# Patient Record
Sex: Male | Born: 2000 | Race: White | Hispanic: No | Marital: Single | Smoking: Never smoker
Health system: Southern US, Community
[De-identification: ages and names within clinical notes are randomized; demographics above are authoritative.]

---

## 2020-12-04 ENCOUNTER — Emergency Department (HOSPITAL_COMMUNITY)
Admission: EM | Admit: 2020-12-04 | Discharge: 2020-12-05 | Disposition: A | Discharge status: Home / Self Care | Payer: BLUE CROSS/BLUE SHIELD | Attending: Emergency Medicine | Admitting: Emergency Medicine

## 2020-12-04 DIAGNOSIS — S61312A Laceration without foreign body of right middle finger with damage to nail, initial encounter: Secondary | ICD-10-CM | POA: Insufficient documentation

## 2020-12-04 DIAGNOSIS — W231XXA Caught, crushed, jammed, or pinched between stationary objects, initial encounter: Secondary | ICD-10-CM | POA: Insufficient documentation

## 2020-12-04 DIAGNOSIS — S61309A Unspecified open wound of unspecified finger with damage to nail, initial encounter: Secondary | ICD-10-CM

## 2020-12-04 DIAGNOSIS — S6992XA Unspecified injury of left wrist, hand and finger(s), initial encounter: Secondary | ICD-10-CM | POA: Diagnosis present

## 2020-12-04 NOTE — ED Provider Notes (Signed)
MSE was initiated and I personally evaluated the patient and placed orders (if any) at  11:57 PM on December 04, 2020.  Finger injury today, tip pinched by recliner, this evening. Nail injury.   Today's Vitals   12/04/20 2258  BP: 130/63  Pulse: 70  Resp: (!) 22  Temp: 98.4 F (36.9 C)  TempSrc: Oral  SpO2: 98%   There is no height or weight on file to calculate BMI.  Right middle finger has nail lac, subungual bleeding, small lac to pad.   The patient appears stable so that the remainder of the MSE may be completed by another provider.   Elpidio Anis, PA-C 12/04/20 2358    Glynn Octave, MD 12/05/20 [phone]

## 2020-12-05 ENCOUNTER — Encounter (HOSPITAL_COMMUNITY): Payer: Self-pay | Admitting: Emergency Medicine

## 2020-12-05 ENCOUNTER — Other Ambulatory Visit: Payer: Self-pay

## 2020-12-05 ENCOUNTER — Emergency Department (HOSPITAL_COMMUNITY): Payer: BLUE CROSS/BLUE SHIELD

## 2020-12-05 NOTE — ED Notes (Signed)
Finger splint applied with kerlix

## 2020-12-05 NOTE — ED Triage Notes (Signed)
Patient injured his right distal middle finger this evening at his recliner , presents with broken finger nail with mild bleeding/swelling .

## 2020-12-05 NOTE — ED Provider Notes (Signed)
[facility] EMERGENCY DEPARTMENT Provider Note   CSN: [id number] Arrival date & time: 12/04/20  2252     History Chief Complaint  Patient presents with  . Finger Injury    Randall Guerrero is a 20 y.o. male.  Patient presents to the emergency department for evaluation of injury to right middle finger.  Patient got his finger caught in a recliner and injured the fingernail.  Patient reports that he was unable to get the bleeding to stop so he presented for evaluation.  Tetanus is up-to-date.  Patient with mild to moderate pain at the distal portion of the middle finger.        History reviewed. No pertinent past medical history.  There are no problems to display for this patient.   History reviewed. No pertinent surgical history.     No family history on file.  Social History   Tobacco Use  . Smoking status: Never Smoker  . Smokeless tobacco: Never Used  Substance Use Topics  . Alcohol use: Never  . Drug use: Never    Home Medications Prior to Admission medications   Not on File    Allergies    Patient has no known allergies.  Review of Systems   Review of Systems  Skin: Positive for wound.  Neurological: Negative.     Physical Exam Updated Vital Signs BP 130/63 (BP Location: Right Arm)   Pulse 70   Temp 98.4 F (36.9 C) (Oral)   Resp (!) 22   Ht 5\' 11"  (1.803 m)   Wt 62 kg   SpO2 98%   BMI 19.06 kg/m   Physical Exam Vitals and nursing note reviewed.  Constitutional:      Appearance: Normal appearance.  HENT:     Head: Atraumatic.  Musculoskeletal:     Right hand: Laceration (Superficial pad of right middle finger) and tenderness (Middle finger) present. No deformity or bony tenderness. Normal strength. Normal sensation. There is no disruption of two-point discrimination. Normal capillary refill.     Comments: Horizontal crack across midportion of fingernail right middle finger, slight oozing of blood  Skin:     Findings: Laceration present.  Neurological:     Mental Status: He is alert.     Sensory: Sensation is intact.     Motor: Motor function is intact.     ED Results / Procedures / Treatments   Labs (all labs ordered are listed, but only abnormal results are displayed) Labs Reviewed - No data to display  EKG None  Radiology DG Finger Middle Right  Result Date: 12/05/2020 CLINICAL DATA:  Right finger laceration EXAM: RIGHT MIDDLE FINGER 2+V COMPARISON:  None. FINDINGS: There is no evidence of fracture or dislocation. There is no evidence of arthropathy or other focal bone abnormality. Soft tissues are unremarkable. IMPRESSION: Negative. Electronically Signed   By: 12/07/2020 MD   On: 12/05/2020 00:22    Procedures Procedures   Medications Ordered in ED Medications - No data to display  ED Course  I have reviewed the triage vital signs and the nursing notes.  Pertinent labs & imaging results that were available during my care of the patient were reviewed by me and considered in my medical decision making (see chart for details).    MDM Rules/Calculators/A&P                          Patient with injury to right middle finger fingernail.  There is no obvious deformity or significant injury to the underlying nail bed.  Do not believe the patient requires removal of the nail or repair of the nailbed at this time.  We will clean the wound and apply dressing, protective splint.  X-ray negative for underlying fracture.  Tetanus up-to-date.  Final Clinical Impression(s) / ED Diagnoses Final diagnoses:  Fingernail avulsion, partial, initial encounter    Rx / DC Orders ED Discharge Orders    None       Knowledge Escandon, Canary Brim, MD 12/05/20 [phone]

## 2022-01-11 IMAGING — DX DG FINGER MIDDLE 2+V*R*
3 series · 3 of 3 positions shown · non-contrast
Comparison: None.

CLINICAL DATA: Right finger laceration

EXAM:
RIGHT MIDDLE FINGER 2+V

[x finger pa right]
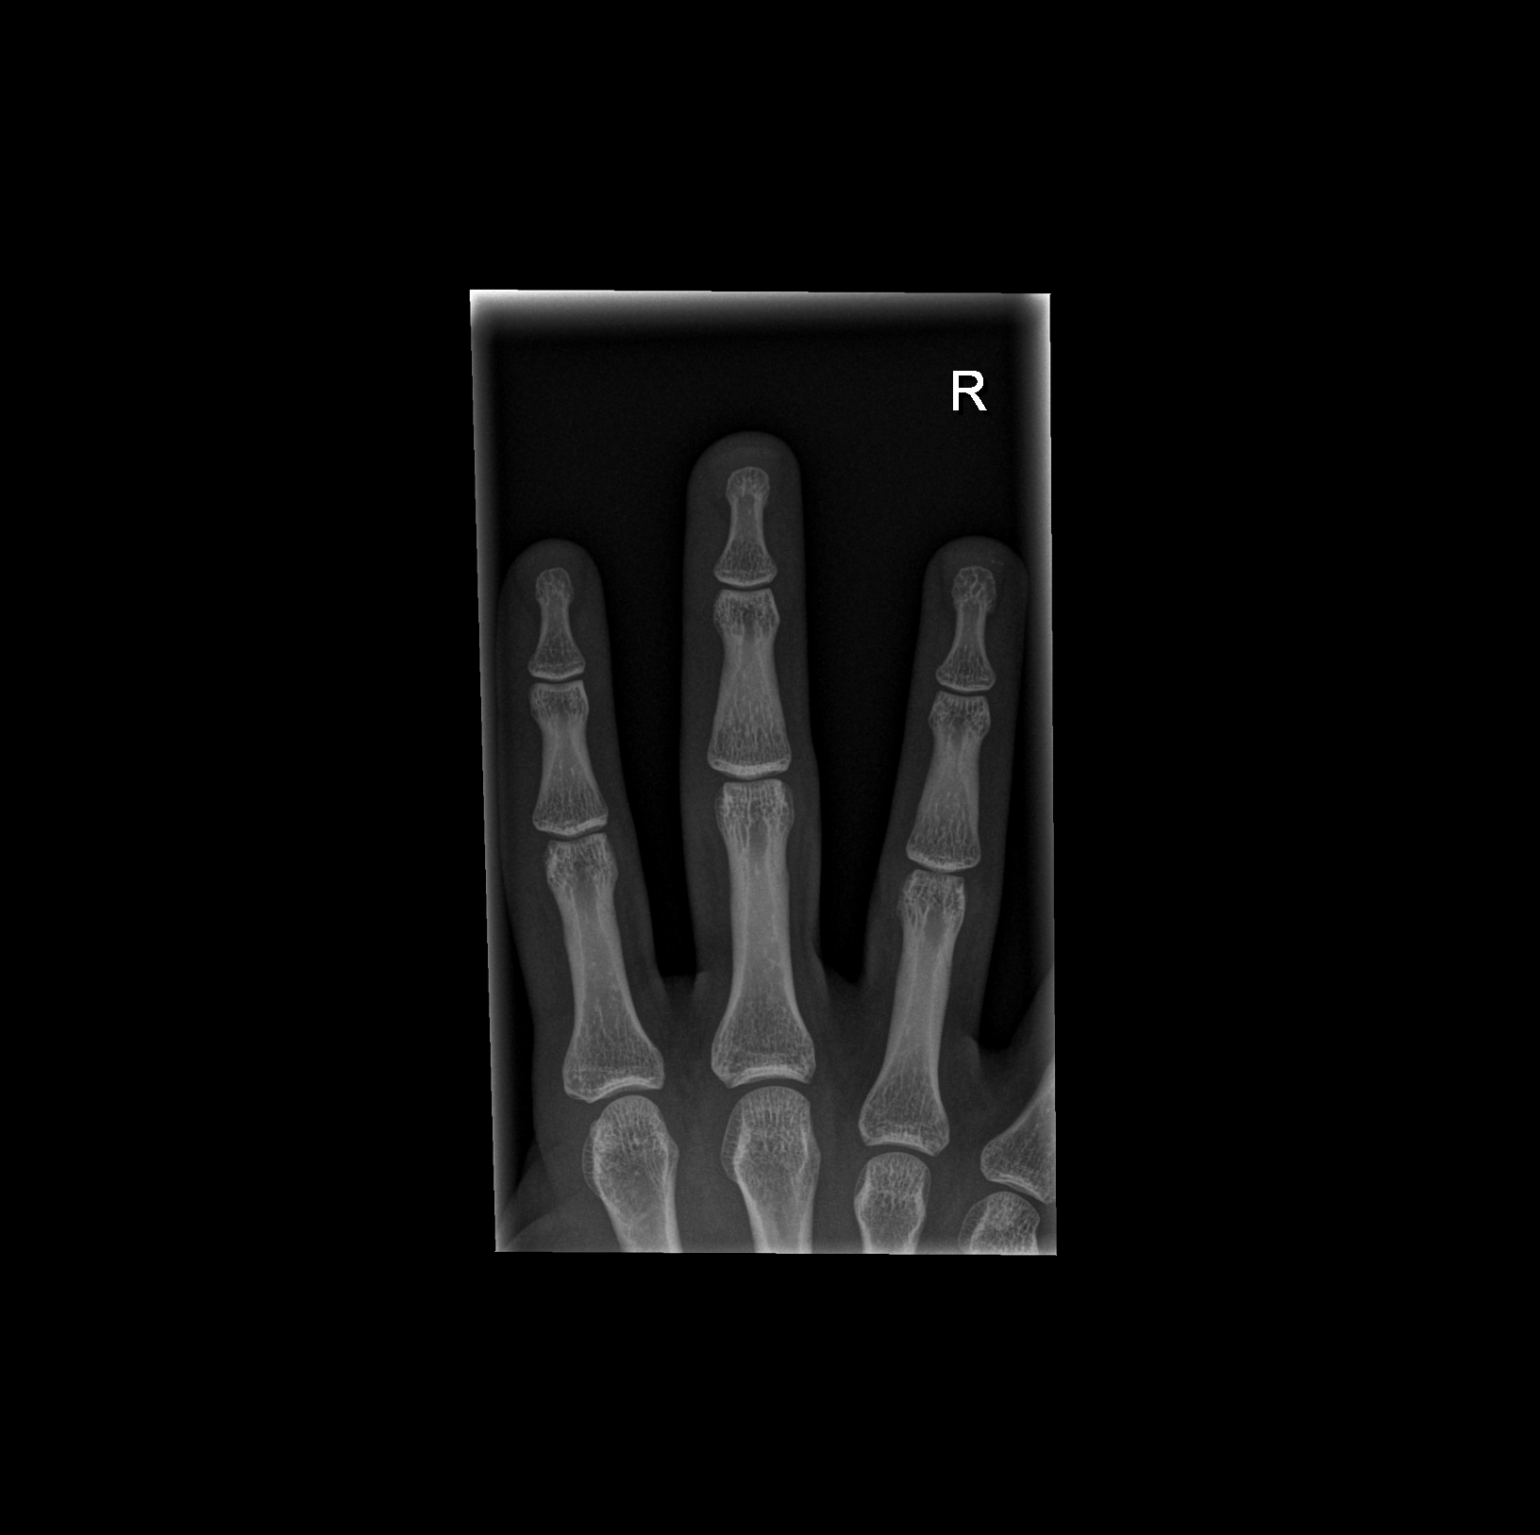

[x finger obl right]
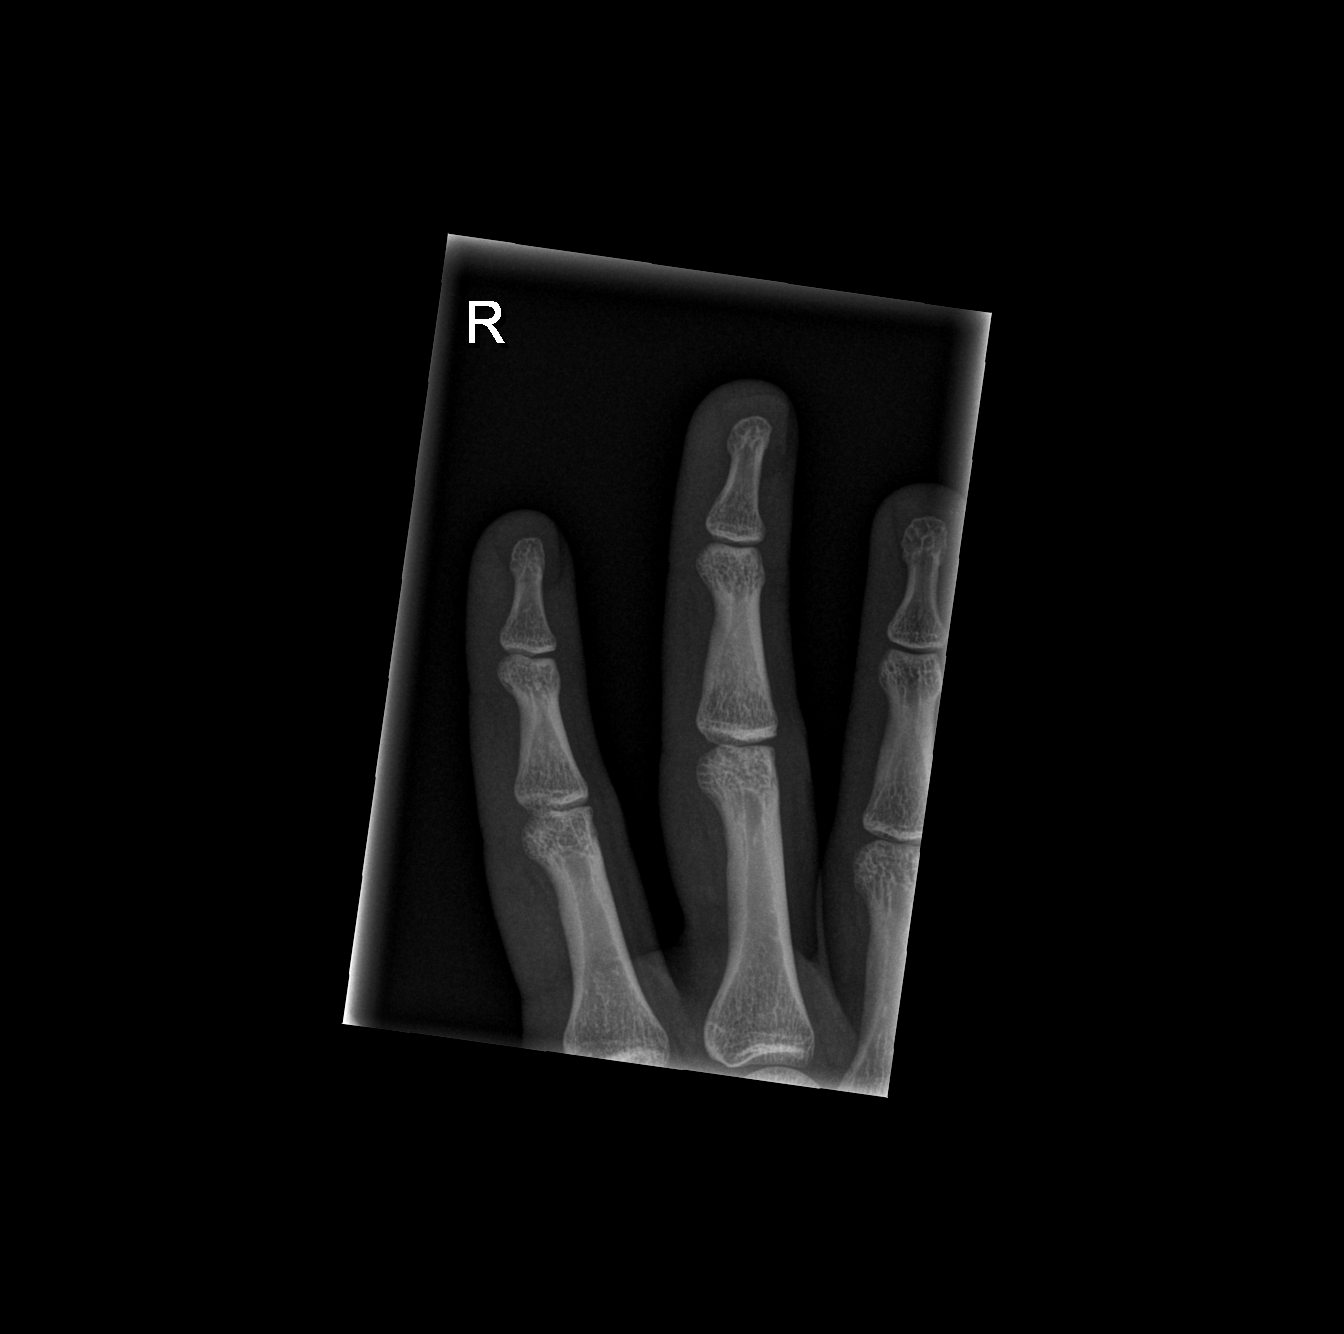

[x finger lat right]
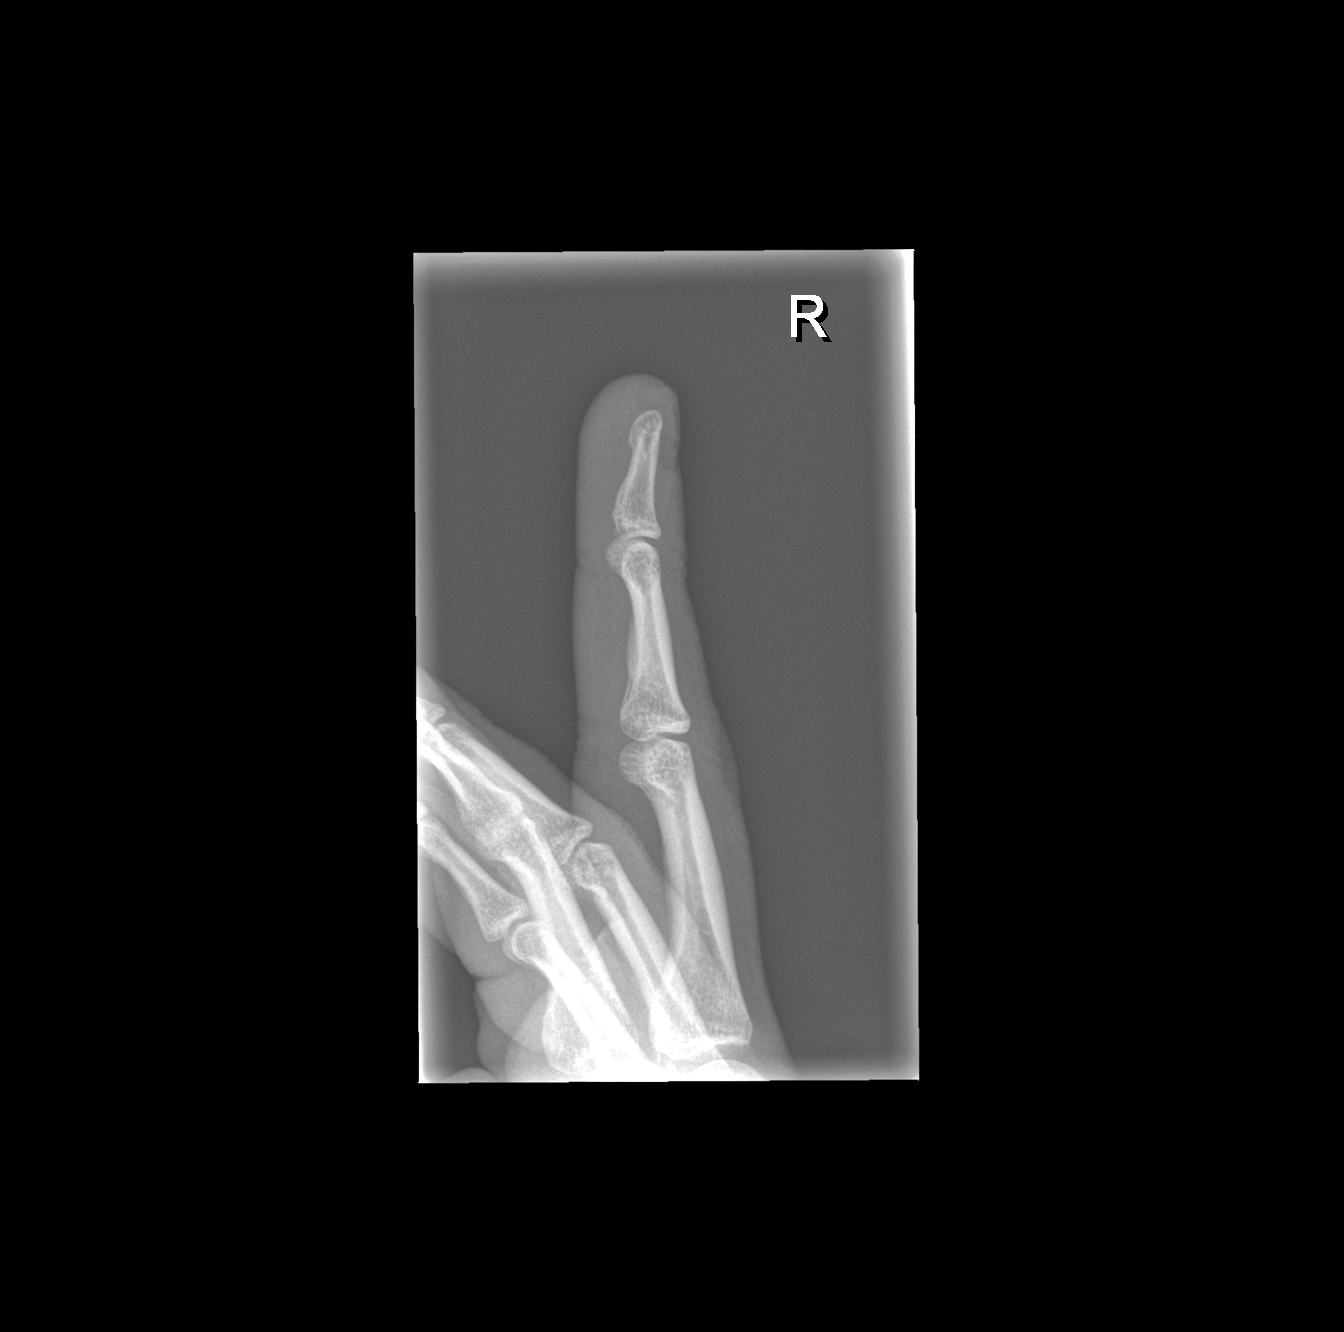

[3 of 3 positions shown; findings below may reference images not displayed]

FINDINGS: There is no evidence of fracture or dislocation. There is no
evidence of arthropathy or other focal bone abnormality. Soft
tissues are unremarkable.
IMPRESSION: Negative.
